# Patient Record
Sex: Male | Born: 1985 | Race: Black or African American | Hispanic: No | Marital: Single | Smoking: Current every day smoker
Health system: Southern US, Community
[De-identification: ages and names within clinical notes are randomized; demographics above are authoritative.]

## PROBLEM LIST (undated history)

## (undated) DIAGNOSIS — W3400XA Accidental discharge from unspecified firearms or gun, initial encounter: Secondary | ICD-10-CM

## (undated) HISTORY — PX: KNEE SURGERY: SHX244

---

## 2007-12-07 ENCOUNTER — Observation Stay (HOSPITAL_COMMUNITY): Admission: EM | Admit: 2007-12-07 | Discharge: 2007-12-08 | Payer: Self-pay | Admitting: Emergency Medicine

## 2007-12-08 ENCOUNTER — Ambulatory Visit: Payer: Self-pay | Admitting: Vascular Surgery
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
## 2011-02-03 LAB — CBC
HCT: 37.6 — ABNORMAL LOW
HCT: 41.6
Hemoglobin: 13
Hemoglobin: 14
RBC: 4.05 — ABNORMAL LOW
RBC: 4.41
RDW: 14.3
RDW: 14.5
WBC: 9.1

## 2011-02-03 LAB — POCT I-STAT, CHEM 8
Chloride: 107
Glucose, Bld: 162 — ABNORMAL HIGH
HCT: 43
Hemoglobin: 14.6
Potassium: 2.9 — ABNORMAL LOW
Sodium: 140

## 2011-02-03 LAB — SAMPLE TO BLOOD BANK

## 2011-02-03 LAB — PROTIME-INR: INR: 1

## 2011-09-06 ENCOUNTER — Emergency Department (HOSPITAL_COMMUNITY)
Admission: EM | Admit: 2011-09-06 | Discharge: 2011-09-06 | Disposition: A | Discharge status: Home / Self Care | Payer: No Typology Code available for payment source | Attending: Emergency Medicine | Admitting: Emergency Medicine

## 2011-09-06 ENCOUNTER — Encounter (HOSPITAL_COMMUNITY): Payer: Self-pay | Admitting: *Deleted

## 2011-09-06 ENCOUNTER — Emergency Department (HOSPITAL_COMMUNITY): Payer: No Typology Code available for payment source

## 2011-09-06 DIAGNOSIS — F17200 Nicotine dependence, unspecified, uncomplicated: Secondary | ICD-10-CM | POA: Insufficient documentation

## 2011-09-06 DIAGNOSIS — M546 Pain in thoracic spine: Secondary | ICD-10-CM | POA: Insufficient documentation

## 2011-09-06 DIAGNOSIS — M542 Cervicalgia: Secondary | ICD-10-CM

## 2011-09-06 DIAGNOSIS — M549 Dorsalgia, unspecified: Secondary | ICD-10-CM

## 2011-09-06 HISTORY — DX: Accidental discharge from unspecified firearms or gun, initial encounter: W34.00XA

## 2011-09-06 MED ORDER — IBUPROFEN 800 MG PO TABS: 800.0000 mg | ORAL_TABLET | Freq: Three times a day (TID) | ORAL | Status: AC | PRN

## 2011-09-06 MED ORDER — OXYCODONE-ACETAMINOPHEN 5-325 MG PO TABS: 2.0000 | ORAL_TABLET | Freq: Four times a day (QID) | ORAL | Status: AC | PRN

## 2011-09-06 MED ORDER — IBUPROFEN 800 MG PO TABS
800.0000 mg | ORAL_TABLET | Freq: Once | ORAL | Status: AC
  Administered 2011-09-06: 800 mg via ORAL
  Filled 2011-09-06: qty 1
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
## 2012-09-19 ENCOUNTER — Encounter (HOSPITAL_COMMUNITY): Payer: Self-pay | Admitting: Emergency Medicine

## 2012-09-19 ENCOUNTER — Emergency Department (HOSPITAL_COMMUNITY)
Admission: EM | Admit: 2012-09-19 | Discharge: 2012-09-19 | Disposition: A | Discharge status: Home / Self Care | Payer: No Typology Code available for payment source | Attending: Emergency Medicine | Admitting: Emergency Medicine

## 2012-09-19 DIAGNOSIS — Y9389 Activity, other specified: Secondary | ICD-10-CM | POA: Insufficient documentation

## 2012-09-19 DIAGNOSIS — Z87828 Personal history of other (healed) physical injury and trauma: Secondary | ICD-10-CM | POA: Insufficient documentation

## 2012-09-19 DIAGNOSIS — F17200 Nicotine dependence, unspecified, uncomplicated: Secondary | ICD-10-CM | POA: Insufficient documentation

## 2012-09-19 DIAGNOSIS — IMO0002 Reserved for concepts with insufficient information to code with codable children: Secondary | ICD-10-CM | POA: Insufficient documentation

## 2012-09-19 DIAGNOSIS — Y92410 Unspecified street and highway as the place of occurrence of the external cause: Secondary | ICD-10-CM | POA: Insufficient documentation

## 2012-09-19 MED ORDER — METHOCARBAMOL 750 MG PO TABS: 750.0000 mg | ORAL_TABLET | Freq: Four times a day (QID) | ORAL | Status: DC

## 2012-09-19 MED ORDER — OXYCODONE-ACETAMINOPHEN 5-325 MG PO TABS
1.0000 | ORAL_TABLET | Freq: Once | ORAL | Status: AC
  Administered 2012-09-19: 1 via ORAL
  Filled 2012-09-19: qty 1

## 2012-09-19 MED ORDER — DIAZEPAM 5 MG PO TABS
5.0000 mg | ORAL_TABLET | Freq: Once | ORAL | Status: AC
  Administered 2012-09-19: 5 mg via ORAL
  Filled 2012-09-19: qty 1

## 2012-09-19 MED ORDER — IBUPROFEN 800 MG PO TABS
800.0000 mg | ORAL_TABLET | Freq: Once | ORAL | Status: AC
  Administered 2012-09-19: 800 mg via ORAL
  Filled 2012-09-19: qty 1

## 2012-09-19 MED ORDER — IBUPROFEN 600 MG PO TABS: 600.0000 mg | ORAL_TABLET | Freq: Four times a day (QID) | ORAL | Status: DC | PRN
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
## 2013-02-14 ENCOUNTER — Encounter (HOSPITAL_COMMUNITY): Payer: Self-pay | Admitting: Emergency Medicine

## 2013-02-14 ENCOUNTER — Emergency Department (HOSPITAL_COMMUNITY): Payer: No Typology Code available for payment source

## 2013-02-14 ENCOUNTER — Emergency Department (HOSPITAL_COMMUNITY)
Admission: EM | Admit: 2013-02-14 | Discharge: 2013-02-14 | Disposition: A | Discharge status: Home / Self Care | Payer: Self-pay | Attending: Emergency Medicine | Admitting: Emergency Medicine

## 2013-02-14 DIAGNOSIS — G8929 Other chronic pain: Secondary | ICD-10-CM | POA: Insufficient documentation

## 2013-02-14 DIAGNOSIS — F17200 Nicotine dependence, unspecified, uncomplicated: Secondary | ICD-10-CM | POA: Insufficient documentation

## 2013-02-14 DIAGNOSIS — M25561 Pain in right knee: Secondary | ICD-10-CM

## 2013-02-14 DIAGNOSIS — M25569 Pain in unspecified knee: Secondary | ICD-10-CM | POA: Insufficient documentation

## 2013-02-14 MED ORDER — IBUPROFEN 800 MG PO TABS: 800.0000 mg | ORAL_TABLET | Freq: Three times a day (TID) | ORAL | Status: DC

## 2013-02-14 MED ORDER — HYDROCODONE-ACETAMINOPHEN 5-325 MG PO TABS: 1.0000 | ORAL_TABLET | Freq: Four times a day (QID) | ORAL | Status: DC | PRN

## 2013-02-14 NOTE — Progress Notes (Signed)
[facility] CL spoke with patient about the [facility]. Patient stated that he had an apt on Monday at [facility] for eligibility and enrollment with the [facility].
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
## 2013-02-15 NOTE — ED Provider Notes (Signed)
Medical screening examination/treatment/procedure(s) were performed by non-physician practitioner and as supervising physician I was immediately available for consultation/collaboration.   Reha Martinovich Y. Odile Veloso, MD 02/15/13 0723 

## 2013-11-20 ENCOUNTER — Encounter (HOSPITAL_COMMUNITY): Payer: Self-pay | Admitting: Emergency Medicine

## 2013-11-20 ENCOUNTER — Emergency Department (HOSPITAL_COMMUNITY)
Admission: EM | Admit: 2013-11-20 | Discharge: 2013-11-20 | Disposition: A | Discharge status: Home / Self Care | Payer: No Typology Code available for payment source | Attending: Emergency Medicine | Admitting: Emergency Medicine

## 2013-11-20 DIAGNOSIS — R110 Nausea: Secondary | ICD-10-CM | POA: Insufficient documentation

## 2013-11-20 DIAGNOSIS — Z87828 Personal history of other (healed) physical injury and trauma: Secondary | ICD-10-CM | POA: Insufficient documentation

## 2013-11-20 DIAGNOSIS — Z791 Long term (current) use of non-steroidal anti-inflammatories (NSAID): Secondary | ICD-10-CM | POA: Insufficient documentation

## 2013-11-20 DIAGNOSIS — F17200 Nicotine dependence, unspecified, uncomplicated: Secondary | ICD-10-CM | POA: Insufficient documentation

## 2013-11-20 DIAGNOSIS — R197 Diarrhea, unspecified: Secondary | ICD-10-CM

## 2013-11-20 MED ORDER — ONDANSETRON HCL 4 MG/2ML IJ SOLN
4.0000 mg | Freq: Once | INTRAMUSCULAR | Status: DC
  Filled 2013-11-20: qty 2

## 2013-11-20 MED ORDER — LOPERAMIDE HCL 2 MG PO CAPS: 2.0000 mg | ORAL_CAPSULE | Freq: Four times a day (QID) | ORAL | Status: AC | PRN
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
## 2013-11-20 NOTE — ED Notes (Signed)
Per pt, ate fast food last night and felt sick awhile better

## 2013-11-20 NOTE — ED Provider Notes (Signed)
Medical screening examination/treatment/procedure(s) were performed by non-physician practitioner and as supervising physician I was immediately available for consultation/collaboration.   EKG Interpretation None       Toy BakerAnthony T Daivd Fredericksen, MD 11/20/13 2242

## 2013-11-20 NOTE — Discharge Instructions (Signed)
Take immodium as directed as needed for diarrhea.  Diarrhea Diarrhea is frequent loose and watery bowel movements. It can cause you to feel weak and dehydrated. Dehydration can cause you to become tired and thirsty, have a dry mouth, and have decreased urination that often is dark yellow. Diarrhea is a sign of another problem, most often an infection that will not last long. In most cases, diarrhea typically lasts 2-3 days. However, it can last longer if it is a sign of something more serious. It is important to treat your diarrhea as directed by your caregive to lessen or prevent future episodes of diarrhea. CAUSES  Some common causes include:  Gastrointestinal infections caused by viruses, bacteria, or parasites.  Food poisoning or food allergies.  Certain medicines, such as antibiotics, chemotherapy, and laxatives.  Artificial sweeteners and fructose.  Digestive disorders. HOME CARE INSTRUCTIONS  Ensure adequate fluid intake (hydration): have 1 cup (8 oz) of fluid for each diarrhea episode. Avoid fluids that contain simple sugars or sports drinks, fruit juices, whole milk products, and sodas. Your urine should be clear or pale yellow if you are drinking enough fluids. Hydrate with an oral rehydration solution that you can purchase at pharmacies, retail stores, and online. You can prepare an oral rehydration solution at home by mixing the following ingredients together:   - tsp table salt.   tsp baking soda.   tsp salt substitute containing potassium chloride.  1  tablespoons sugar.  1 L (34 oz) of water.  Certain foods and beverages may increase the speed at which food moves through the gastrointestinal (GI) tract. These foods and beverages should be avoided and include:  Caffeinated and alcoholic beverages.  High-fiber foods, such as raw fruits and vegetables, nuts, seeds, and whole grain breads and cereals.  Foods and beverages sweetened with sugar alcohols, such as xylitol,  sorbitol, and mannitol.  Some foods may be well tolerated and may help thicken stool including:  Starchy foods, such as rice, toast, pasta, low-sugar cereal, oatmeal, grits, baked potatoes, crackers, and bagels.  Bananas.  Applesauce.  Add probiotic-rich foods to help increase healthy bacteria in the GI tract, such as yogurt and fermented milk products.  Wash your hands well after each diarrhea episode.  Only take over-the-counter or prescription medicines as directed by your caregiver.  Take a warm bath to relieve any burning or pain from frequent diarrhea episodes. SEEK IMMEDIATE MEDICAL CARE IF:   You are unable to keep fluids down.  You have persistent vomiting.  You have blood in your stool, or your stools are black and tarry.  You do not urinate in 6-8 hours, or there is only a small amount of very dark urine.  You have abdominal pain that increases or localizes.  You have weakness, dizziness, confusion, or lightheadedness.  You have a severe headache.  Your diarrhea gets worse or does not get better.  You have a fever or persistent symptoms for more than 2-3 days.  You have a fever and your symptoms suddenly get worse. MAKE SURE YOU:   Understand these instructions.  Will watch your condition.  Will get help right away if you are not doing well or get worse. Document Released: 04/14/2002 Document Revised: 04/10/2012 Document Reviewed: 12/31/2011 [facility] Patient Information 2015 [place], [place]. This information is not intended to replace advice given to you by your health care provider. Make sure you discuss any questions you have with your health care provider.

## 2013-11-20 NOTE — ED Notes (Signed)
Bed: WA09 Expected date:  Expected time:  Means of arrival:  Comments: Hold for triage 1 

## 2014-06-23 ENCOUNTER — Emergency Department (HOSPITAL_COMMUNITY): Payer: Self-pay

## 2014-06-23 ENCOUNTER — Emergency Department (HOSPITAL_COMMUNITY)
Admission: EM | Admit: 2014-06-23 | Discharge: 2014-06-23 | Disposition: A | Discharge status: Home / Self Care | Payer: Self-pay | Attending: Emergency Medicine | Admitting: Emergency Medicine

## 2014-06-23 ENCOUNTER — Encounter (HOSPITAL_COMMUNITY): Payer: Self-pay | Admitting: Emergency Medicine

## 2014-06-23 DIAGNOSIS — Y92410 Unspecified street and highway as the place of occurrence of the external cause: Secondary | ICD-10-CM | POA: Insufficient documentation

## 2014-06-23 DIAGNOSIS — Z720 Tobacco use: Secondary | ICD-10-CM | POA: Insufficient documentation

## 2014-06-23 DIAGNOSIS — R52 Pain, unspecified: Secondary | ICD-10-CM

## 2014-06-23 DIAGNOSIS — S199XXA Unspecified injury of neck, initial encounter: Secondary | ICD-10-CM | POA: Insufficient documentation

## 2014-06-23 DIAGNOSIS — S8992XA Unspecified injury of left lower leg, initial encounter: Secondary | ICD-10-CM | POA: Insufficient documentation

## 2014-06-23 DIAGNOSIS — Z87828 Personal history of other (healed) physical injury and trauma: Secondary | ICD-10-CM | POA: Insufficient documentation

## 2014-06-23 DIAGNOSIS — M25562 Pain in left knee: Secondary | ICD-10-CM

## 2014-06-23 DIAGNOSIS — M25561 Pain in right knee: Secondary | ICD-10-CM

## 2014-06-23 DIAGNOSIS — S8991XA Unspecified injury of right lower leg, initial encounter: Secondary | ICD-10-CM | POA: Insufficient documentation

## 2014-06-23 DIAGNOSIS — Z79899 Other long term (current) drug therapy: Secondary | ICD-10-CM | POA: Insufficient documentation

## 2014-06-23 DIAGNOSIS — Y9389 Activity, other specified: Secondary | ICD-10-CM | POA: Insufficient documentation

## 2014-06-23 DIAGNOSIS — M542 Cervicalgia: Secondary | ICD-10-CM

## 2014-06-23 DIAGNOSIS — Y998 Other external cause status: Secondary | ICD-10-CM | POA: Insufficient documentation

## 2014-06-23 MED ORDER — IBUPROFEN 800 MG PO TABS: 800.0000 mg | ORAL_TABLET | Freq: Three times a day (TID) | ORAL | Status: DC | PRN

## 2014-06-23 MED ORDER — METHOCARBAMOL 500 MG PO TABS: 500.0000 mg | ORAL_TABLET | Freq: Four times a day (QID) | ORAL | Status: DC | PRN
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
## 2014-10-28 ENCOUNTER — Encounter (HOSPITAL_COMMUNITY): Payer: Self-pay | Admitting: *Deleted

## 2014-10-28 ENCOUNTER — Emergency Department (HOSPITAL_COMMUNITY): Payer: No Typology Code available for payment source

## 2014-10-28 ENCOUNTER — Emergency Department (HOSPITAL_COMMUNITY)
Admission: EM | Admit: 2014-10-28 | Discharge: 2014-10-28 | Disposition: A | Discharge status: Home / Self Care | Payer: No Typology Code available for payment source | Attending: Emergency Medicine | Admitting: Emergency Medicine

## 2014-10-28 DIAGNOSIS — S199XXA Unspecified injury of neck, initial encounter: Secondary | ICD-10-CM | POA: Diagnosis not present

## 2014-10-28 DIAGNOSIS — Z720 Tobacco use: Secondary | ICD-10-CM | POA: Insufficient documentation

## 2014-10-28 DIAGNOSIS — S20211A Contusion of right front wall of thorax, initial encounter: Secondary | ICD-10-CM | POA: Diagnosis not present

## 2014-10-28 DIAGNOSIS — Y92410 Unspecified street and highway as the place of occurrence of the external cause: Secondary | ICD-10-CM | POA: Diagnosis not present

## 2014-10-28 DIAGNOSIS — Y999 Unspecified external cause status: Secondary | ICD-10-CM | POA: Insufficient documentation

## 2014-10-28 DIAGNOSIS — Y9389 Activity, other specified: Secondary | ICD-10-CM | POA: Insufficient documentation

## 2014-10-28 DIAGNOSIS — S3992XA Unspecified injury of lower back, initial encounter: Secondary | ICD-10-CM | POA: Diagnosis not present

## 2014-10-28 DIAGNOSIS — Z87828 Personal history of other (healed) physical injury and trauma: Secondary | ICD-10-CM | POA: Diagnosis not present

## 2014-10-28 DIAGNOSIS — M542 Cervicalgia: Secondary | ICD-10-CM

## 2014-10-28 DIAGNOSIS — S4991XA Unspecified injury of right shoulder and upper arm, initial encounter: Secondary | ICD-10-CM | POA: Insufficient documentation

## 2014-10-28 DIAGNOSIS — M25561 Pain in right knee: Secondary | ICD-10-CM | POA: Diagnosis not present

## 2014-10-28 DIAGNOSIS — G8921 Chronic pain due to trauma: Secondary | ICD-10-CM | POA: Insufficient documentation

## 2014-10-28 MED ORDER — HYDROCODONE-ACETAMINOPHEN 5-325 MG PO TABS
2.0000 | ORAL_TABLET | Freq: Once | ORAL | Status: AC
  Administered 2014-10-28: 2 via ORAL
  Filled 2014-10-28: qty 2

## 2014-10-28 MED ORDER — IBUPROFEN 400 MG PO TABS
800.0000 mg | ORAL_TABLET | Freq: Once | ORAL | Status: AC
  Administered 2014-10-28: 800 mg via ORAL
  Filled 2014-10-28: qty 2

## 2014-10-28 MED ORDER — HYDROCODONE-ACETAMINOPHEN 5-325 MG PO TABS: 1.0000 | ORAL_TABLET | Freq: Four times a day (QID) | ORAL | Status: AC | PRN

## 2014-10-28 MED ORDER — IBUPROFEN 800 MG PO TABS: 800.0000 mg | ORAL_TABLET | Freq: Three times a day (TID) | ORAL | Status: AC | PRN

## 2014-10-28 MED ORDER — METHOCARBAMOL 500 MG PO TABS: 500.0000 mg | ORAL_TABLET | Freq: Four times a day (QID) | ORAL | Status: AC | PRN

## 2014-10-28 MED ORDER — METHOCARBAMOL 500 MG PO TABS
500.0000 mg | ORAL_TABLET | Freq: Once | ORAL | Status: AC
  Administered 2014-10-28: 500 mg via ORAL
  Filled 2014-10-28: qty 1
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
## 2014-10-28 NOTE — Discharge Instructions (Signed)
1. Medications: robaxin, ibuprofen, vicodin, usual home medications 2. Treatment: rest, drink plenty of fluids, gentle stretching as discussed, alternate ice and heat 3. Follow Up: Please followup with your primary doctor in 3 days for discussion of your diagnoses and further evaluation after today's visit; if you do not have a primary care doctor use the resource guide provided to find one;  Return to the ER for worsening back pain, difficulty walking, loss of bowel or bladder control or other concerning symptoms     Cervical Sprain A cervical sprain is an injury in the neck in which the strong, fibrous tissues (ligaments) that connect your neck bones stretch or tear. Cervical sprains can range from mild to severe. Severe cervical sprains can cause the neck vertebrae to be unstable. This can lead to damage of the spinal cord and can result in serious nervous system problems. The amount of time it takes for a cervical sprain to get better depends on the cause and extent of the injury. Most cervical sprains heal in 1 to 3 weeks. CAUSES  Severe cervical sprains may be caused by:   Contact sport injuries (such as from football, rugby, wrestling, hockey, auto racing, gymnastics, diving, martial arts, or boxing).   Motor vehicle collisions.   Whiplash injuries. This is an injury from a sudden forward and backward whipping movement of the head and neck.  Falls.  Mild cervical sprains may be caused by:   Being in an awkward position, such as while cradling a telephone between your ear and shoulder.   Sitting in a chair that does not offer proper support.   Working at a poorly Marketing executive station.   Looking up or down for long periods of time.  SYMPTOMS   Pain, soreness, stiffness, or a burning sensation in the front, back, or sides of the neck. This discomfort may develop immediately after the injury or slowly, 24 hours or more after the injury.   Pain or tenderness directly  in the middle of the back of the neck.   Shoulder or upper back pain.   Limited ability to move the neck.   Headache.   Dizziness.   Weakness, numbness, or tingling in the hands or arms.   Muscle spasms.   Difficulty swallowing or chewing.   Tenderness and swelling of the neck.  DIAGNOSIS  Most of the time your health care provider can diagnose a cervical sprain by taking your history and doing a physical exam. Your health care provider will ask about previous neck injuries and any known neck problems, such as arthritis in the neck. X-rays may be taken to find out if there are any other problems, such as with the bones of the neck. Other tests, such as a CT scan or MRI, may also be needed.  TREATMENT  Treatment depends on the severity of the cervical sprain. Mild sprains can be treated with rest, keeping the neck in place (immobilization), and pain medicines. Severe cervical sprains are immediately immobilized. Further treatment is done to help with pain, muscle spasms, and other symptoms and may include:  Medicines, such as pain relievers, numbing medicines, or muscle relaxants.   Physical therapy. This may involve stretching exercises, strengthening exercises, and posture training. Exercises and improved posture can help stabilize the neck, strengthen muscles, and help stop symptoms from returning.  HOME CARE INSTRUCTIONS   Put ice on the injured area.   Put ice in a plastic bag.   Place a towel between your skin and  the bag.   Leave the ice on for 15-20 minutes, 3-4 times a day.   If your injury was severe, you may have been given a cervical collar to wear. A cervical collar is a two-piece collar designed to keep your neck from moving while it heals.  Do not remove the collar unless instructed by your health care provider.  If you have long hair, keep it outside of the collar.  Ask your health care provider before making any adjustments to your collar. Minor  adjustments may be required over time to improve comfort and reduce pressure on your chin or on the back of your head.  Ifyou are allowed to remove the collar for cleaning or bathing, follow your health care provider's instructions on how to do so safely.  Keep your collar clean by wiping it with mild soap and water and drying it completely. If the collar you have been given includes removable pads, remove them every 1-2 days and hand wash them with soap and water. Allow them to air dry. They should be completely dry before you wear them in the collar.  If you are allowed to remove the collar for cleaning and bathing, wash and dry the skin of your neck. Check your skin for irritation or sores. If you see any, tell your health care provider.  Do not drive while wearing the collar.   Only take over-the-counter or prescription medicines for pain, discomfort, or fever as directed by your health care provider.   Keep all follow-up appointments as directed by your health care provider.   Keep all physical therapy appointments as directed by your health care provider.   Make any needed adjustments to your workstation to promote good posture.   Avoid positions and activities that make your symptoms worse.   Warm up and stretch before being active to help prevent problems.  SEEK MEDICAL CARE IF:   Your pain is not controlled with medicine.   You are unable to decrease your pain medicine over time as planned.   Your activity level is not improving as expected.  SEEK IMMEDIATE MEDICAL CARE IF:   You develop any bleeding.  You develop stomach upset.  You have signs of an allergic reaction to your medicine.   Your symptoms get worse.   You develop new, unexplained symptoms.   You have numbness, tingling, weakness, or paralysis in any part of your body.  MAKE SURE YOU:   Understand these instructions.  Will watch your condition.  Will get help right away if you are not  doing well or get worse. Document Released: 02/19/2007 Document Revised: 04/29/2013 Document Reviewed: 10/30/2012 [facility] Patient Information 2015 [place], [place]. This information is not intended to replace advice given to you by your health care provider. Make sure you discuss any questions you have with your health care provider.    Emergency Department Resource Guide 1) Find a Doctor and Pay Out of Pocket Although you won't have to find out who is covered by your insurance plan, it is a good idea to ask around and get recommendations. You will then need to call the office and see if the doctor you have chosen will accept you as a new patient and what types of options they offer for patients who are self-pay. Some doctors offer discounts or will set up payment plans for their patients who do not have insurance, but you will need to ask so you aren't surprised when you get to your appointment.  2) Contact Your Local Health Department Not all health departments have doctors that can see patients for sick visits, but many do, so it is worth a call to see if yours does. If you don't know where your local health department is, you can check in your phone book. The CDC also has a tool to help you locate your state's health department, and many state websites also have listings of all of their local health departments.  3) Find a Walk-in Clinic If your illness is not likely to be very severe or complicated, you may want to try a walk in clinic. These are popping up all over the country in pharmacies, drugstores, and shopping centers. They're usually staffed by nurse practitioners or physician assistants that have been trained to treat common illnesses and complaints. They're usually fairly quick and inexpensive. However, if you have serious medical issues or chronic medical problems, these are probably not your best option.  No Primary Care Doctor: - Call Health Connect at  [phone] - they can help you  locate a primary care doctor that  accepts your insurance, provides certain services, etc. - Physician Referral Service- [phone]  Chronic Pain Problems: Organization         Address  Phone   Notes  [facility]  [phone] Patients need to be referred by their primary care doctor.   Medication Assistance: Organization         Address  Phone   Notes  [facility] Medication [facility] [address] [place] [place], [place] [address] [phone] --Must be a resident of [facility] -- Must have NO insurance coverage whatsoever (no Medicaid/ Medicare, etc.) -- The pt. MUST have a primary care doctor that directs their care regularly and follows them in the community   MedAssist  [phone]   [employer]  [phone]    Agencies that provide inexpensive medical care: Organization         Address  Phone   Notes  [facility]  [phone]   [facility]    [phone]   [facility] [address] [place], [place] [address] [phone]   [facility] [address] [place], [place] [phone]   Planned Parenthood    [phone]   [facility]    [phone]   Community Health and [facility]  [address], [place] Phone:  [phone], Fax:  [phone] Hours of Operation:  9 am - 6 pm, M-F.  Also accepts Medicaid/Medicare and self-pay.  [facility]  [address], [place] Phone: [phone], Fax: [phone]. Hours of Operation:  8:30 am - 5:30 pm, M-F.  Also accepts Medicaid and self-pay.  [facility] [place] [address], [place] Phone: [phone]   [facility] [address], [place] [phone], Ext. [phone] Mondays & Thursdays: 7-9 AM.  First 15 patients are seen on a first come, first serve basis.    Medicaid-accepting [facility]  Providers:  Organization         Address  Phone   Notes  [facility] [address],  [phone] Also accepts self-pay patients.  [facility] [address] Laurell Josephs [place], [place]  [phone]   [facility] [address]  [place], [place] [phone]   [facility] [address], [place] [phone]   Renaye Rakers [address], [place]   [phone] Only accepts [place] Access [place] patients after they have their name applied to their card.   Self-Pay (no insurance) in [facility]:  Organization         Address  Phone   Notes  Sickle Cell Patients, [facility] [address] [place], [place] [phone]   [facility] [address] [place], [place] [phone]   [facility]  [address], [place] [phone]   [facility]/Dr. Osei-Bonsu  [address], [place] or [address], [place] [phone] Phone number for both [place] and [place] locations is the same.  [facility]gent [facility] and [facility] [address], [place] [phone]   [facility] [address] [place] or [address] [phone]   [facility] [address], [place] [phone], phone; [phone], fax Sees patients 1st and 3rd Saturday of every month.  Must not qualify for public or private insurance (i.e. Medicaid, Medicare, [place] Health Choice, Veterans' Benefits)  Household income should be no more than 200% of the poverty level The clinic cannot treat you if you are pregnant or think you are pregnant  Sexually transmitted diseases are not treated at the clinic.    Dental Care: Organization         Address  Phone  Notes  [facility] Department of [facility] [address] [place], [place] [phone] Accepts children up to age 101 who are enrolled in [place] or [place] Health Choice; pregnant women with a Medicaid card; and children who have applied for Medicaid or [place] Health Choice, but were declined, whose parents can pay a reduced fee at time of service.  [facility] Department of [facility]  [address], [place] [phone] Accepts children up to age 44 who are enrolled in [place] or [place] Health Choice; pregnant women with a Medicaid card; and children who have applied for Medicaid or [place] Health Choice, but were declined, whose parents can pay a reduced fee at time of service.  [facility]  [address] [place], [place] [phone] Patients are seen by appointment only. Walk-ins are not accepted. [facility] will see patients 60 years of age and older. Monday - Tuesday (8am-5pm) Most Wednesdays (8:30-5pm) $30 per visit, cash only  [facility] Adult Dental Access PRO[facility]  [address], [facility] [phone] Patients are seen by appointment only. Walk-ins are not accepted. [facility] will see patients 33 years of age and older. One Wednesday Evening (Monthly: Volunteer Based).  $30 per visit, cash only  [employer]  [phone] for adults; Children under age 1, call Graduate Pediatric Dentistry at [phone]. Children aged 15-14, please call [phone] to request a pediatric application.  Dental services are provided in all areas of dental care including fillings, crowns and bridges, complete and partial dentures, implants, gum treatment, root canals, and extractions. Preventive care is also provided. Treatment is provided to both adults and children. Patients are selected via a lottery and there is often a waiting list.   [facility] [address], [place]in[place] Otto  [phone] [email or web address]   [facility]  [address] [place], [place] [phone], Ext. [phone] Second and Fourth Thursday of each month, opens at 6:30 AM; Clinic ends at 9 AM.  Patients are seen on a first-come first-served basis, and a limited number are seen during each clinic.   [facility]  [address] Ether Griffins [place], [place] [phone]   Eligibility Requirements You must have lived in [place], [place], or [place] counties for at least the last three months.   You cannot be eligible for state or federal sponsored [place], including [employer], [place], or [employer].   You generally cannot be eligible for healthcare insurance through your employer.    How to apply: Eligibility screenings are held every Tuesday and Wednesday afternoon from 1:00 pm until 4:00 pm. You do not need an appointment for the interview!  [facility] [address] [place], [place] [phone]   [facility] Health Department  [phone]   [facility] Health Department  [phone]   [facility] Health Department  [phone]    Behavioral Health Resources in the Community: Intensive Outpatient Programs Organization         Address  Phone  Notes  [facility] [address] [place], [place] [phone]   [facility] Outpatient [address] [place], [place] [phone]   [facility] [address], [place], [place]  [phone]   [facility] Mental Health [address],  [place], [place] [phone] or [phone]   Substance Abuse Resources Organization         Address  Phone  Notes  [facility]  [phone]   [facility]  [phone]   [facility]  [phone]   Floydene Flock  [phone]   Residential & Outpatient Substance Abuse Program  [phone]   Psychological Services Organization         Address  Phone  Notes  [facility]  [phone]     [facility] Services  [phone]   [facility] Mental Health [address], [place] [phone] or [phone]    Mobile Crisis Teams Organization         Address  Phone  Notes  [facility], Mobile Crisis Care Unit  [phone]   [facility]  [address] [place], [place] [phone]   Doristine Locks [address] [place] [phone]    Self-Help/Support Groups Organization         Address  Phone             Notes  [facility] [place] - variety of support groups  336- I7437963 Call for more information  Narcotics Anonymous (NA), [facility] [address], [employer] [place]  2 meetings at this location   Statistician         Address  Phone  Notes  [facility] [address],    [employer] [place]  [phone]   [facility]  [address], [place] [address], [place], [place] [phone]   [facility] [address] [place], [place] [phone] Admissions: 8am-3pm M-F  [facility] [address],    [place], [place] [phone]   [facility] [address] [place], [place], [place] [phone]   [facility] [address],  [place], [place] [phone]   [facility]  Programs - Intensive Outpatient [address], [place], [place] [phone]   [facility] ([facility] [address] [place]  [place], [place] [phone] or [phone]   [facility] ([facility]) [address], [place], [place] [phone] Accepts Medicaid  [facility] [address],  [place] [phone] Substance Abuse/Addiction Treatment   [facility] Organization         Address  Phone  Notes  [facility]  [phone]   Angie Fava, PhD [address] Ervin Knack [place], [place]   [phone] or [phone]    [facility] Behavioral   [address] [place], [place] [phone]   [facility] [address], [place], [place] [phone] Insurance/Medicaid/sponsorship through [facility] and Families [address]                                    [place], [place] [phone] Therapy/tele-psych/case  [facility] [address] [place], [place] [phone]    Dr. Lolly Mustache  [phone]   [facility]  [employer] [place] [facility]. 1) [address] [place] 2) [address], [place] 3)  [address], [place] [phone]  [phone]   [facility] Child Abuse Hotline 562-568-2078 or [phone] (After Hours)

## 2019-11-06 ENCOUNTER — Emergency Department (HOSPITAL_COMMUNITY): Payer: No Typology Code available for payment source

## 2019-11-06 ENCOUNTER — Encounter (HOSPITAL_COMMUNITY): Payer: Self-pay | Admitting: Emergency Medicine

## 2019-11-06 ENCOUNTER — Emergency Department (HOSPITAL_COMMUNITY)
Admission: EM | Admit: 2019-11-06 | Discharge: 2019-11-06 | Disposition: A | Discharge status: Home / Self Care | Payer: No Typology Code available for payment source | Attending: Emergency Medicine | Admitting: Emergency Medicine

## 2019-11-06 DIAGNOSIS — M549 Dorsalgia, unspecified: Secondary | ICD-10-CM | POA: Insufficient documentation

## 2019-11-06 DIAGNOSIS — M542 Cervicalgia: Secondary | ICD-10-CM | POA: Diagnosis present

## 2019-11-06 DIAGNOSIS — F17210 Nicotine dependence, cigarettes, uncomplicated: Secondary | ICD-10-CM | POA: Diagnosis not present

## 2019-11-06 DIAGNOSIS — Y9289 Other specified places as the place of occurrence of the external cause: Secondary | ICD-10-CM | POA: Diagnosis not present

## 2019-11-06 DIAGNOSIS — M25561 Pain in right knee: Secondary | ICD-10-CM | POA: Diagnosis not present

## 2019-11-06 DIAGNOSIS — Y9389 Activity, other specified: Secondary | ICD-10-CM | POA: Insufficient documentation

## 2019-11-06 DIAGNOSIS — Y998 Other external cause status: Secondary | ICD-10-CM | POA: Diagnosis not present

## 2019-11-06 MED ORDER — METHOCARBAMOL 500 MG PO TABS
500.0000 mg | ORAL_TABLET | Freq: Two times a day (BID) | ORAL | 0 refills | Status: AC
Start: 2019-11-06 — End: ?

## 2019-11-06 MED ORDER — NAPROXEN 500 MG PO TABS
500.0000 mg | ORAL_TABLET | Freq: Two times a day (BID) | ORAL | 0 refills | Status: AC
Start: 2019-11-06 — End: ?

## 2019-11-06 MED ORDER — LIDOCAINE 5 % EX PTCH
1.0000 | MEDICATED_PATCH | CUTANEOUS | Status: DC
  Administered 2019-11-06: 1 via TRANSDERMAL
  Filled 2019-11-06: qty 1

## 2019-11-06 NOTE — Discharge Instructions (Signed)
As discussed, your x-ray was negative for any broken bones.  I have placed you in a knee splint for comfort.  I am sending you home with pain medication and muscle relaxer.  Take as needed.  Muscle relaxer can cause drowsiness do not drive or operate machinery while on the medication.  You may also purchase over-the-counter Lidoderm patches and Voltaren gel for added pain relief.  Continue to ice and elevate your right leg.  Please follow-up with your PCP if symptoms not improved within the next week.  Return to the ER for new or worsening symptoms.
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
## 2020-10-13 ENCOUNTER — Emergency Department (HOSPITAL_COMMUNITY)
Admission: EM | Admit: 2020-10-13 | Discharge: 2020-10-13 | Disposition: A | Discharge status: Home / Self Care | Payer: No Typology Code available for payment source | Attending: Emergency Medicine | Admitting: Emergency Medicine

## 2020-10-13 ENCOUNTER — Encounter (HOSPITAL_COMMUNITY): Payer: Self-pay | Admitting: *Deleted

## 2020-10-13 ENCOUNTER — Other Ambulatory Visit: Payer: Self-pay

## 2020-10-13 DIAGNOSIS — G8929 Other chronic pain: Secondary | ICD-10-CM | POA: Insufficient documentation

## 2020-10-13 DIAGNOSIS — M25512 Pain in left shoulder: Secondary | ICD-10-CM | POA: Insufficient documentation

## 2020-10-13 DIAGNOSIS — F17210 Nicotine dependence, cigarettes, uncomplicated: Secondary | ICD-10-CM | POA: Diagnosis not present

## 2020-10-13 DIAGNOSIS — M549 Dorsalgia, unspecified: Secondary | ICD-10-CM | POA: Insufficient documentation

## 2020-10-13 NOTE — ED Triage Notes (Signed)
Pt complains of back, left shoulder pain radiating to neck. He was recently released from physical therapy following an injury. He reports pain started while at work.
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
## 2020-10-13 NOTE — Discharge Instructions (Signed)
Return for any problem.  ?

## 2022-01-06 IMAGING — CR DG KNEE COMPLETE 4+V*R*
4 series · 4 of 4 positions shown · non-contrast
Comparison: 06/23/2014

CLINICAL DATA: Injury

EXAM:
RIGHT KNEE - COMPLETE 4+ VIEW

[t knee ap right]
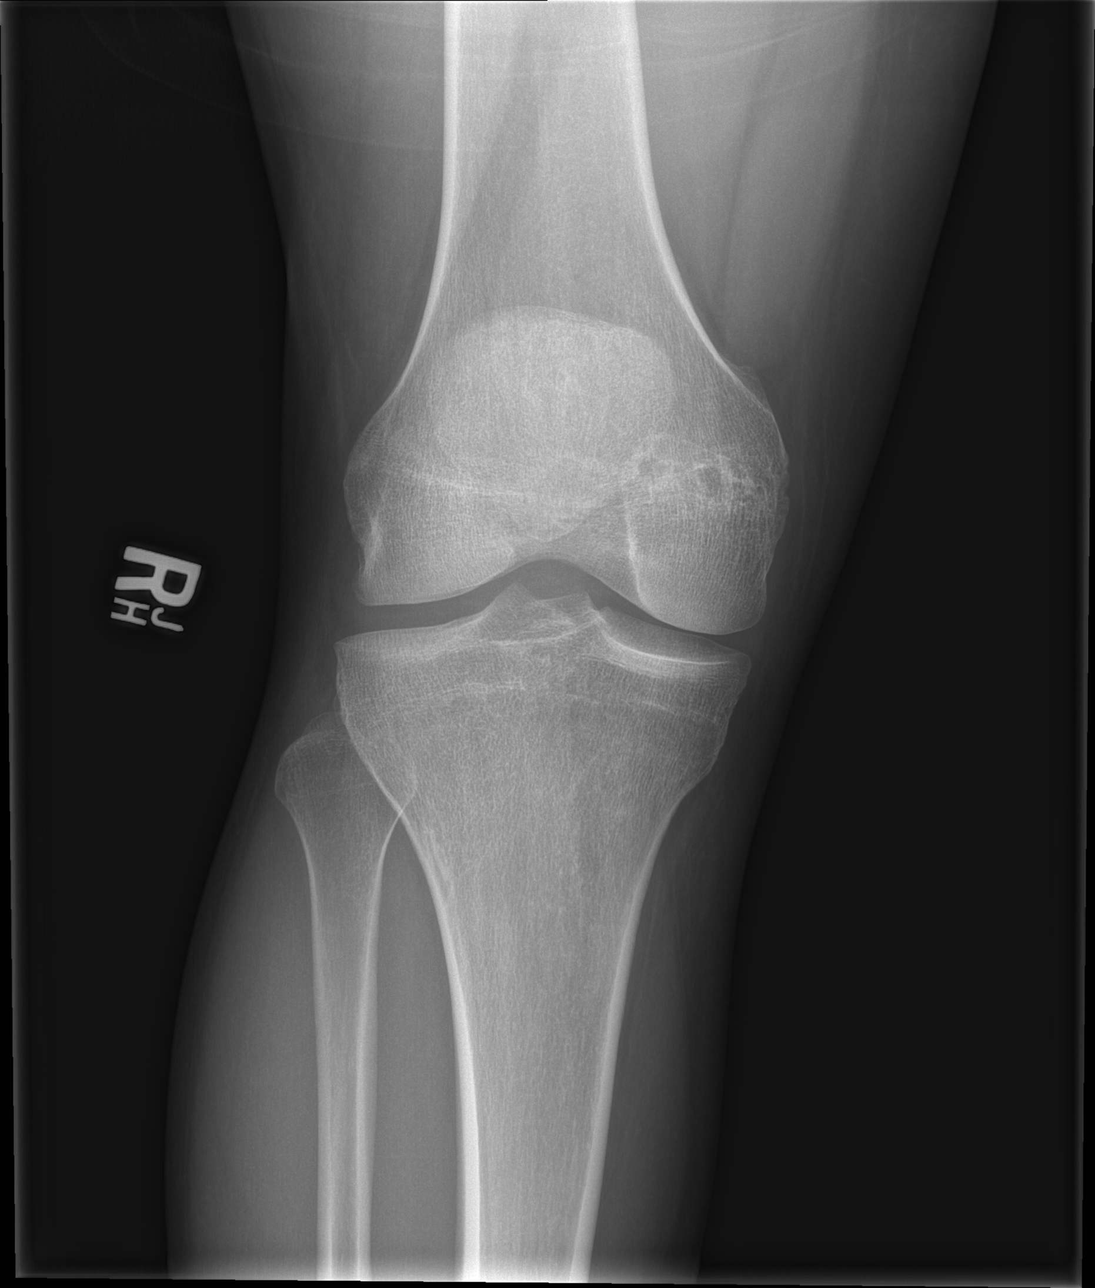

[t knee obl right (1 of 2)]
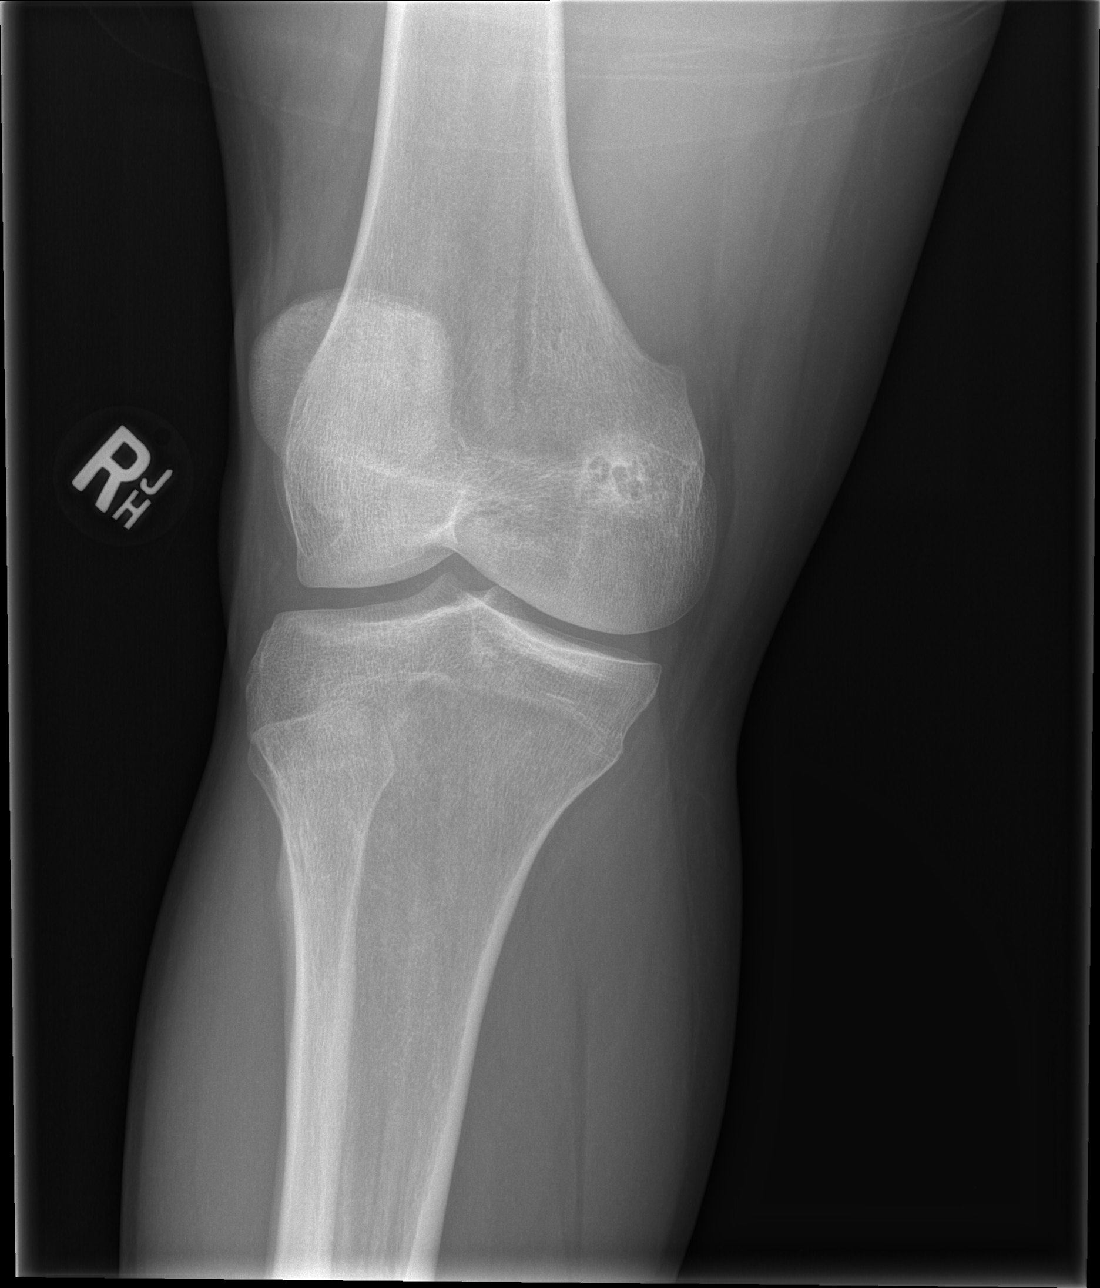

[t knee obl right (2 of 2)]
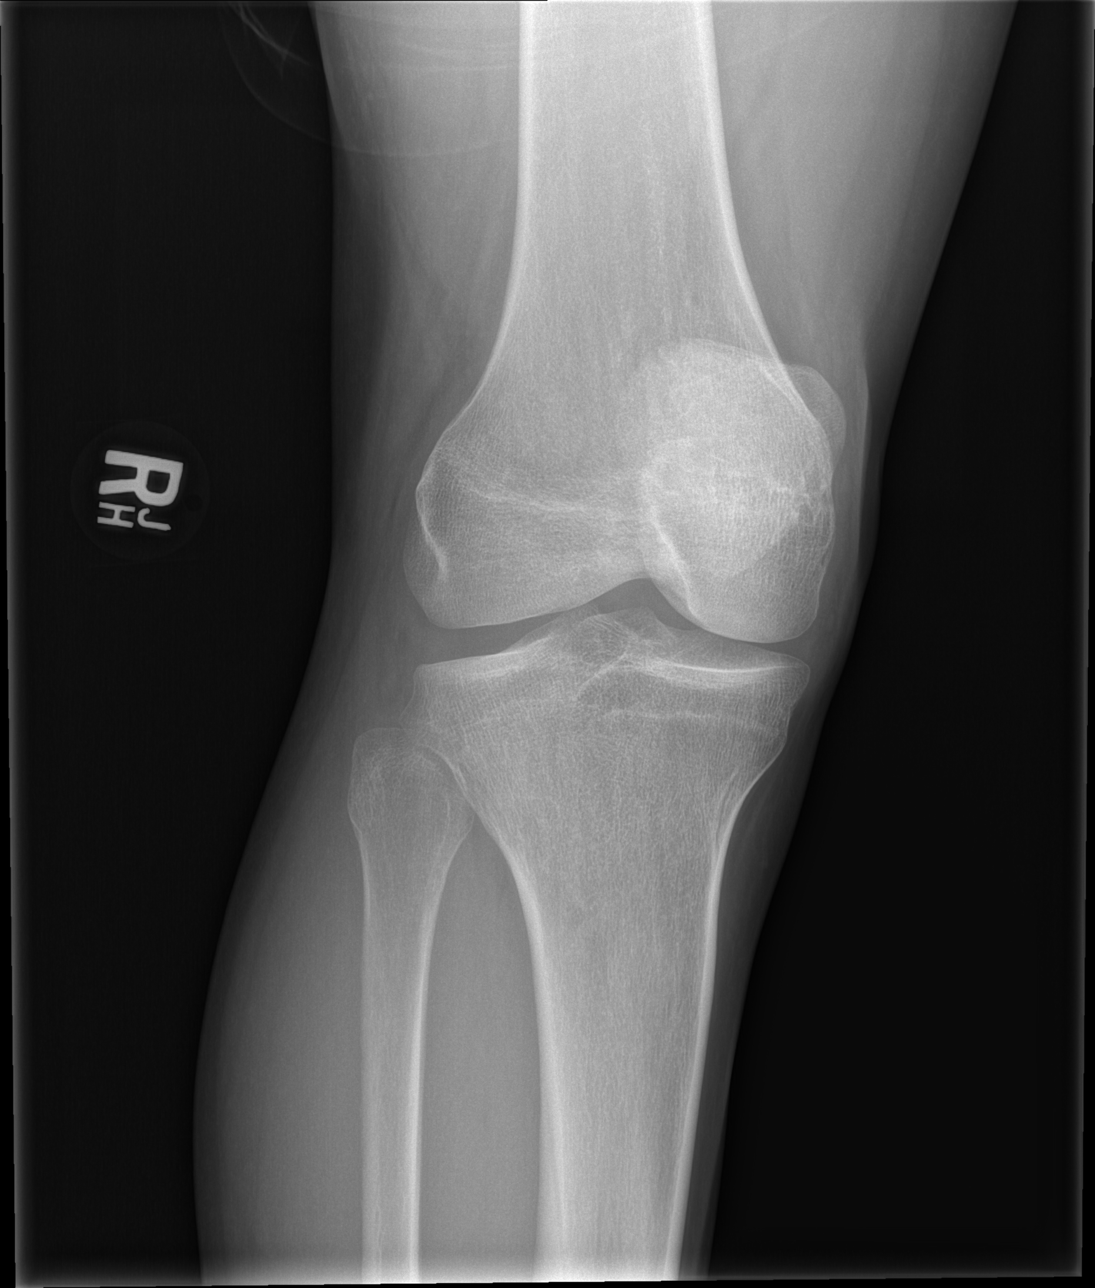

[t knee lat right]
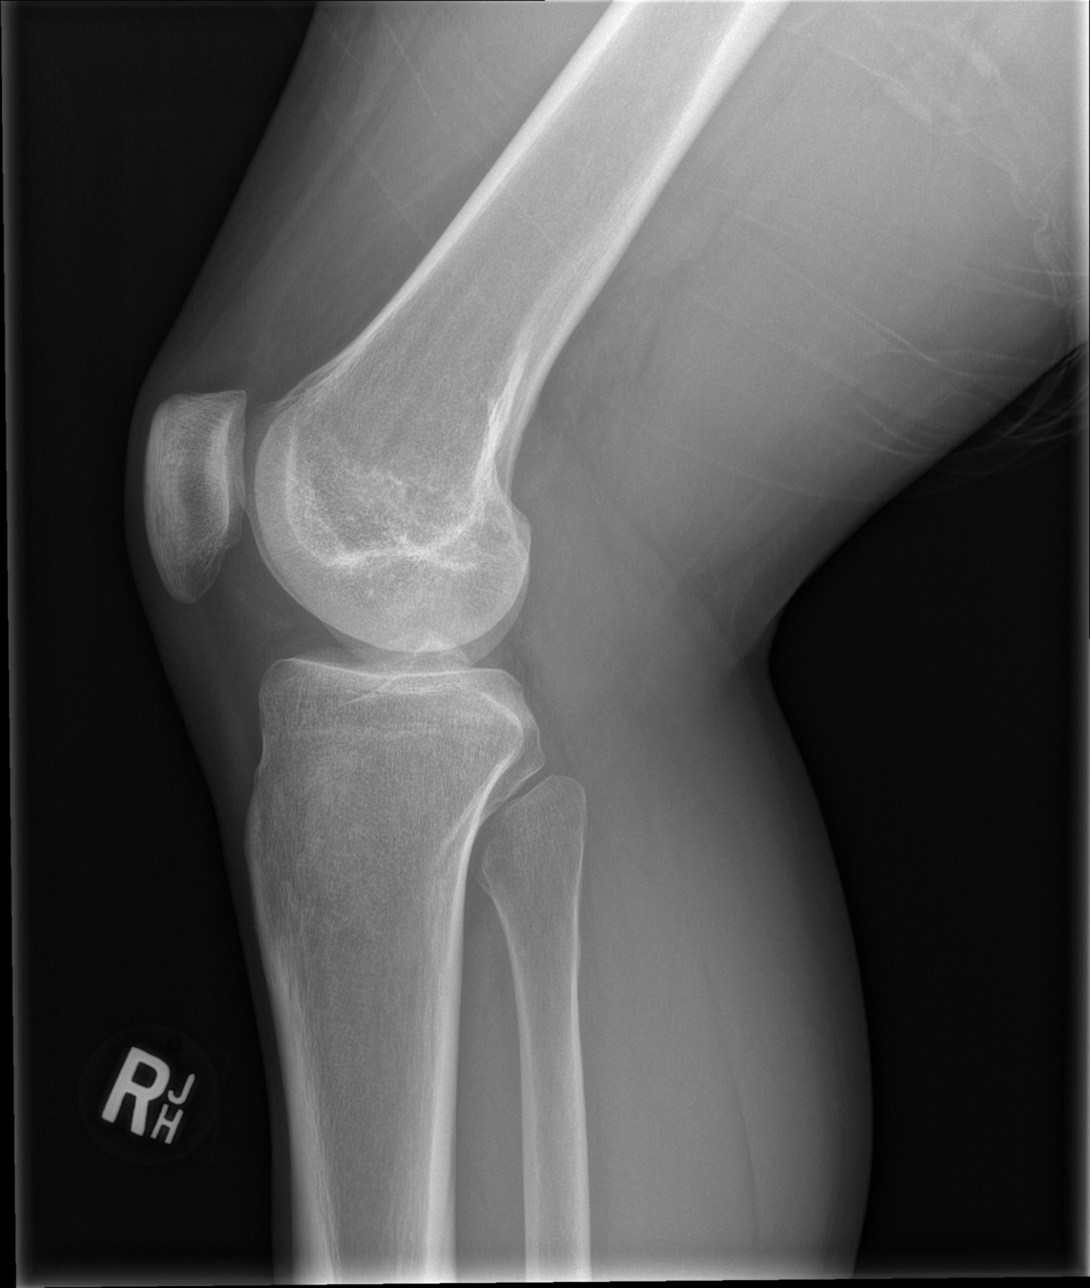

[4 of 4 positions shown; findings below may reference images not displayed]

FINDINGS: No fracture or malalignment. No significant knee effusion. Stable
lucency and sclerosis within the medial femoral condyle.
IMPRESSION: No acute osseous abnormality

## 2024-05-22 ENCOUNTER — Emergency Department (HOSPITAL_COMMUNITY)
Admission: EM | Admit: 2024-05-22 | Discharge: 2024-05-22 | Disposition: A | Discharge status: Home / Self Care | Attending: Emergency Medicine | Admitting: Emergency Medicine

## 2024-05-22 ENCOUNTER — Emergency Department (HOSPITAL_COMMUNITY)

## 2024-05-22 DIAGNOSIS — M542 Cervicalgia: Secondary | ICD-10-CM | POA: Diagnosis present

## 2024-05-22 DIAGNOSIS — Y92410 Unspecified street and highway as the place of occurrence of the external cause: Secondary | ICD-10-CM | POA: Insufficient documentation

## 2024-05-22 DIAGNOSIS — S39012A Strain of muscle, fascia and tendon of lower back, initial encounter: Secondary | ICD-10-CM | POA: Diagnosis not present

## 2024-05-22 DIAGNOSIS — S161XXA Strain of muscle, fascia and tendon at neck level, initial encounter: Secondary | ICD-10-CM | POA: Diagnosis not present

## 2024-05-22 MED ORDER — IBUPROFEN 800 MG PO TABS: 800.0000 mg | ORAL_TABLET | Freq: Three times a day (TID) | ORAL | 0 refills | Status: AC

## 2024-05-22 MED ORDER — CYCLOBENZAPRINE HCL 10 MG PO TABS: 5.0000 mg | ORAL_TABLET | Freq: Two times a day (BID) | ORAL | 0 refills | Status: AC | PRN
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
